# Patient Record
Sex: Male | Born: 1990 | Race: Black or African American | Hispanic: No | Marital: Single | Smoking: Never smoker
Health system: Southern US, Community
[De-identification: ages and names within clinical notes are randomized; demographics above are authoritative.]

---

## 2010-09-08 ENCOUNTER — Emergency Department (HOSPITAL_COMMUNITY)
Admission: EM | Admit: 2010-09-08 | Discharge: 2010-09-08 | Disposition: A | Discharge status: Home / Self Care | Payer: 59 | Attending: Emergency Medicine | Admitting: Emergency Medicine

## 2010-09-08 DIAGNOSIS — J029 Acute pharyngitis, unspecified: Secondary | ICD-10-CM | POA: Insufficient documentation

## 2010-09-28 ENCOUNTER — Emergency Department (HOSPITAL_COMMUNITY): Payer: 59

## 2010-09-28 ENCOUNTER — Emergency Department (HOSPITAL_COMMUNITY)
Admission: EM | Admit: 2010-09-28 | Discharge: 2010-09-29 | Disposition: A | Discharge status: Home / Self Care | Payer: 59 | Attending: Emergency Medicine | Admitting: Emergency Medicine

## 2010-09-28 DIAGNOSIS — H539 Unspecified visual disturbance: Secondary | ICD-10-CM | POA: Insufficient documentation

## 2010-09-28 DIAGNOSIS — R42 Dizziness and giddiness: Secondary | ICD-10-CM | POA: Insufficient documentation

## 2010-09-28 DIAGNOSIS — R110 Nausea: Secondary | ICD-10-CM | POA: Insufficient documentation

## 2010-09-28 DIAGNOSIS — N342 Other urethritis: Secondary | ICD-10-CM | POA: Insufficient documentation

## 2010-09-28 DIAGNOSIS — G43909 Migraine, unspecified, not intractable, without status migrainosus: Secondary | ICD-10-CM | POA: Insufficient documentation

## 2010-09-28 LAB — POCT I-STAT, CHEM 8
BUN: 10 mg/dL (ref 6–23)
Creatinine, Ser: 0.9 mg/dL (ref 0.50–1.35)
Glucose, Bld: 81 mg/dL (ref 70–99)
Potassium: 4.1 mEq/L (ref 3.5–5.1)
Sodium: 140 mEq/L (ref 135–145)

## 2010-09-30 LAB — GC/CHLAMYDIA PROBE AMP, GENITAL: Chlamydia, DNA Probe: POSITIVE — AB

## 2011-01-19 ENCOUNTER — Encounter: Payer: Self-pay | Admitting: Emergency Medicine

## 2011-01-19 ENCOUNTER — Emergency Department (HOSPITAL_COMMUNITY)
Admission: EM | Admit: 2011-01-19 | Discharge: 2011-01-19 | Payer: 59 | Attending: Emergency Medicine | Admitting: Emergency Medicine

## 2011-01-19 DIAGNOSIS — B9789 Other viral agents as the cause of diseases classified elsewhere: Secondary | ICD-10-CM | POA: Insufficient documentation

## 2011-01-19 DIAGNOSIS — J029 Acute pharyngitis, unspecified: Secondary | ICD-10-CM | POA: Insufficient documentation

## 2011-01-19 DIAGNOSIS — R509 Fever, unspecified: Secondary | ICD-10-CM | POA: Insufficient documentation

## 2011-01-19 DIAGNOSIS — IMO0001 Reserved for inherently not codable concepts without codable children: Secondary | ICD-10-CM | POA: Insufficient documentation

## 2011-01-19 DIAGNOSIS — B349 Viral infection, unspecified: Secondary | ICD-10-CM

## 2011-01-19 LAB — RAPID STREP SCREEN (MED CTR MEBANE ONLY): Streptococcus, Group A Screen (Direct): NEGATIVE

## 2011-01-19 MED ORDER — IBUPROFEN 800 MG PO TABS
800.0000 mg | ORAL_TABLET | Freq: Once | ORAL | Status: AC
  Administered 2011-01-19: 800 mg via ORAL
  Filled 2011-01-19: qty 1

## 2011-01-19 MED ORDER — ACETAMINOPHEN 325 MG PO TABS
ORAL_TABLET | ORAL | Status: AC
  Administered 2011-01-19: 650 mg via ORAL
  Filled 2011-01-19: qty 2

## 2011-01-19 NOTE — ED Notes (Signed)
Fever chills and body aches since yesterday

## 2011-01-19 NOTE — ED Notes (Signed)
Vital signs stable.  Temp 98.7 oral.  Pt is having nausea and ambulates to the bathroom to void

## 2011-01-19 NOTE — ED Provider Notes (Signed)
History     CSN: [id number] Arrival date & time: 01/19/2011  2:34 PM   First MD Initiated Contact with Patient 01/19/11 1516      Chief Complaint  Patient presents with  . Generalized Body Aches    (Consider location/radiation/quality/duration/timing/severity/associated sxs/prior treatment) The history is provided by the patient.  pt with 24 hours h/o fever and sore throat--no vomiting or diarrhea or rash--no meds used pta--no cough, positive sick exposures, denies photophobia or neck pain--nothing makes sx better or worse  History reviewed. No pertinent past medical history.  History reviewed. No pertinent past surgical history.  No family history on file.  History  Substance Use Topics  . Smoking status: Not on file  . Smokeless tobacco: Not on file  . Alcohol Use: Not on file      Review of Systems  All other systems reviewed and are negative.    Allergies  Review of patient's allergies indicates no known allergies.  Home Medications   Current Outpatient Rx  Name Route Sig Dispense Refill  . BENZOCAINE-MENTHOL 6-10 MG MT LOZG Oral Take 1 lozenge by mouth every 2 (two) hours as needed. For sore throat     . DIPHENHYDRAMINE HCL 25 MG PO TABS Oral Take 25 mg by mouth every 6 (six) hours as needed. For congestion       BP 117/73  Pulse 106  Temp(Src) 103 F (39.4 C) (Oral)  Resp 20  SpO2 98%  Physical Exam  Nursing note and vitals reviewed. Constitutional: He is oriented to person, place, and time. He appears well-developed and well-nourished.  Non-toxic appearance. No distress.  HENT:  Head: Normocephalic and atraumatic.  Mouth/Throat: Posterior oropharyngeal erythema present. No oropharyngeal exudate.  Eyes: Conjunctivae and EOM are normal. Pupils are equal, round, and reactive to light.  Neck: Normal range of motion. Neck supple. No tracheal deviation present.  Cardiovascular: Normal heart sounds.  Tachycardia present.  Exam reveals no gallop.   No  murmur heard. Pulmonary/Chest: Effort normal and breath sounds normal. No stridor. No respiratory distress. He has no wheezes.  Abdominal: Soft. Normal appearance and bowel sounds are normal. He exhibits no distension. There is no tenderness. There is no rebound.  Musculoskeletal: Normal range of motion. He exhibits no edema and no tenderness.  Neurological: He is alert and oriented to person, place, and time. He has normal strength. No cranial nerve deficit or sensory deficit. GCS eye subscore is 4. GCS verbal subscore is 5. GCS motor subscore is 6.  Skin: Skin is warm and dry.  Psychiatric: He has a normal mood and affect. His speech is normal and behavior is normal.    ED Course  Procedures (including critical care time)   Labs Reviewed  RAPID STREP SCREEN   No results found.   No diagnosis found.    MDM  Labs reviewed, suspect viral syndrome, will d/c        Toy Baker, MD 01/19/11 [phone]

## 2011-01-19 NOTE — ED Notes (Signed)
Family at bedside. 

## 2011-02-12 ENCOUNTER — Emergency Department (HOSPITAL_COMMUNITY)
Admission: EM | Admit: 2011-02-12 | Discharge: 2011-02-12 | Disposition: A | Discharge status: Home / Self Care | Payer: 59 | Attending: Emergency Medicine | Admitting: Emergency Medicine

## 2011-02-12 ENCOUNTER — Encounter (HOSPITAL_COMMUNITY): Payer: Self-pay | Admitting: *Deleted

## 2011-02-12 DIAGNOSIS — L309 Dermatitis, unspecified: Secondary | ICD-10-CM

## 2011-02-12 DIAGNOSIS — L299 Pruritus, unspecified: Secondary | ICD-10-CM | POA: Insufficient documentation

## 2011-02-12 DIAGNOSIS — L259 Unspecified contact dermatitis, unspecified cause: Secondary | ICD-10-CM | POA: Insufficient documentation

## 2011-02-12 MED ORDER — AZITHROMYCIN 250 MG PO TABS
1000.0000 mg | ORAL_TABLET | Freq: Once | ORAL | Status: AC
  Administered 2011-02-12: 1000 mg via ORAL
  Filled 2011-02-12: qty 4

## 2011-02-12 MED ORDER — LIDOCAINE HCL (PF) 1 % IJ SOLN
INTRAMUSCULAR | Status: AC
  Administered 2011-02-12: 1 mL
  Filled 2011-02-12: qty 5

## 2011-02-12 MED ORDER — CEFTRIAXONE SODIUM 250 MG IJ SOLR
250.0000 mg | Freq: Once | INTRAMUSCULAR | Status: AC
  Administered 2011-02-12: 250 mg via INTRAMUSCULAR
  Filled 2011-02-12: qty 250

## 2011-02-12 NOTE — ED Notes (Signed)
Patient states onset 2 weeks ago tiny nodules on patient penis with one blister. Denies pain or itching concerned what is it.  Patient states started to use a new soap, sexual active uses protection.  Denies any urinary complaints.

## 2011-02-12 NOTE — ED Notes (Signed)
The pt wants to be checked for a std.  He has lesions that he has had for 2 weeks

## 2011-02-12 NOTE — ED Notes (Signed)
Patient given discharge paperwork; went over discharge instructions with patient.  Patient instructed to follow up with Urgent Care and to return to the ED for new, worsening, or concerning symptoms.

## 2011-02-12 NOTE — ED Notes (Signed)
Received bedside report from [place], [place].  Patient currently resting comfortably in bed; no respiratory or acute distress noted.  Introduced self to patient and updated patient on plan of care; informed patient that we are currently waiting on the EDP to come and assess patient.  Patient has no other questions or concerns at this time; will continue to monitor.

## 2011-02-12 NOTE — ED Notes (Signed)
Tiffany Greene, PA at bedside 

## 2011-02-12 NOTE — ED Provider Notes (Signed)
History     CSN: [id number] Arrival date & time: 02/12/2011  6:08 PM   First MD Initiated Contact with Patient 02/12/11 2002      Chief Complaint  Patient presents with  . SEXUALLY TRANSMITTED DISEASE    (Consider location/radiation/quality/duration/timing/severity/associated sxs/prior treatment) The history is provided by the patient.    Pt presents to the Ed with complaints of a small rash on the anterior shaft of his penis. It is not painful, itchy, red, or draining. He denies penile discharge. His girlfriend who is present and him have been together for 6 months and use protection every time. The girlfriend denies any rashes, discharge or odor. He has had the small rash for 2 weeks. And does know have the rash anywhere else. The patient says that he tried a new condom but does not remember what came first, the rash or the new condom.  History reviewed. No pertinent past medical history.  History reviewed. No pertinent past surgical history.  History reviewed. No pertinent family history.  History  Substance Use Topics  . Smoking status: Never Smoker   . Smokeless tobacco: Not on file  . Alcohol Use: No      Review of Systems  All other systems reviewed and are negative.    Allergies  Review of patient's allergies indicates no known allergies.  Home Medications   Current Outpatient Rx  Name Route Sig Dispense Refill  . BENZOCAINE-MENTHOL 6-10 MG MT LOZG Oral Take 1 lozenge by mouth every 2 (two) hours as needed. For sore throat       BP 134/84  Pulse 104  Temp(Src) 98.1 F (36.7 C) (Oral)  Resp 14  SpO2 98%  Physical Exam  Nursing note and vitals reviewed. Constitutional: He appears well-developed and well-nourished.  HENT:  Head: Normocephalic and atraumatic.  Eyes: Pupils are equal, round, and reactive to light.  Neck: Normal range of motion.  Cardiovascular: Normal rate and regular rhythm.   Pulmonary/Chest: Effort normal and breath sounds  normal.  Genitourinary:     Musculoskeletal: Normal range of motion.  Neurological: He is alert.  Skin: Skin is warm and dry.    ED Course  Procedures (including critical care time)  Labs Reviewed - No data to display No results found.   No diagnosis found.    MDM  The rash on the penis is not consistent with any STD. It looks like dermatitis, no ulcerations, not red, or painful, no discharge, no other lesions, not wart like. The patient has been using a new brand of condom. I have recommended they try latex free and he use lotion on all of his body as most of his skin is very dry. Pt given strict instructions to come back if anything changes.      Dorthula Matas, PA 02/12/11 2023

## 2011-02-12 NOTE — ED Notes (Signed)
GC/Chlamydia Probe placed at bedside for PA use.

## 2011-02-12 NOTE — ED Notes (Signed)
Patient currently resting quietly in bed; no respiratory or acute distress noted.  Family present at bedside.  Patient has no questions or concerns at this time.  Will continue to monitor. 

## 2011-02-12 NOTE — ED Provider Notes (Signed)
Medical screening examination/treatment/procedure(s) were performed by non-physician practitioner and as supervising physician I was immediately available for consultation/collaboration.   Glynn Octave, MD 02/12/11 [phone]

## 2011-02-12 NOTE — ED Notes (Signed)
Patient waiting in waiting area another 10 minutes before discharge to watch for any reaction that could occur from medication administration.

## 2011-03-16 ENCOUNTER — Emergency Department (HOSPITAL_COMMUNITY)
Admission: EM | Admit: 2011-03-16 | Discharge: 2011-03-16 | Disposition: A | Discharge status: Home / Self Care | Payer: 59 | Attending: Emergency Medicine | Admitting: Emergency Medicine

## 2011-03-16 ENCOUNTER — Encounter (HOSPITAL_COMMUNITY): Payer: Self-pay | Admitting: *Deleted

## 2011-03-16 DIAGNOSIS — B081 Molluscum contagiosum: Secondary | ICD-10-CM | POA: Insufficient documentation

## 2011-03-16 DIAGNOSIS — R21 Rash and other nonspecific skin eruption: Secondary | ICD-10-CM | POA: Insufficient documentation

## 2011-03-16 NOTE — ED Provider Notes (Signed)
History     CSN: [id number]  Arrival date & time 03/16/11  2054   First MD Initiated Contact with Patient 03/16/11 2300      Chief Complaint  Patient presents with  . Allergic Reaction    (Consider location/radiation/quality/duration/timing/severity/associated sxs/prior treatment) Patient is a 21 y.o. male presenting with allergic reaction and STD exposure. The history is provided by the patient. No language interpreter was used.  Allergic Reaction The primary symptoms are  rash. The current episode started more than 2 days ago (5 weeks ago). The problem has not changed since onset.This is a chronic problem.  Location: penis. The rash is not associated with itching.  Significant symptoms that are not present include itching.  Exposure to STD This is a new problem. The current episode started more than 1 week ago. The symptoms are aggravated by nothing. The symptoms are relieved by nothing. He has tried nothing for the symptoms. The treatment provided no relief.  States he wears a condom.  Doesn't know how this happened.    History reviewed. No pertinent past medical history.  History reviewed. No pertinent past surgical history.  History reviewed. No pertinent family history.  History  Substance Use Topics  . Smoking status: Never Smoker   . Smokeless tobacco: Not on file  . Alcohol Use: No      Review of Systems  Constitutional: Negative.   HENT: Negative.   Eyes: Negative.   Respiratory: Negative.   Cardiovascular: Negative.   Gastrointestinal: Negative.   Genitourinary: Negative for dysuria, discharge, penile pain and testicular pain.  Skin: Positive for rash. Negative for itching.  Neurological: Negative.   Hematological: Negative.   Psychiatric/Behavioral: Negative.     Allergies  Review of patient's allergies indicates no known allergies.  Home Medications  No current outpatient prescriptions on file.  BP 139/73  Pulse 93  Temp(Src) 98.3 F (36.8 C)  (Oral)  Resp 20  Ht 5\' 6"  (1.676 m)  Wt 130 lb (58.968 kg)  BMI 20.98 kg/m2  SpO2 99%  Physical Exam  Constitutional: He is oriented to person, place, and time. He appears well-developed and well-nourished.  HENT:  Head: Normocephalic and atraumatic.  Eyes: Conjunctivae and EOM are normal.  Neck: Normal range of motion. Neck supple.  Cardiovascular: Normal rate and regular rhythm.   Pulmonary/Chest: Effort normal and breath sounds normal. He has no wheezes.  Abdominal: Soft. Bowel sounds are normal. There is no tenderness.  Genitourinary: No penile tenderness.       Multiple raised lesions which are umbilicated cw molluscum.  Karen Kitchens present as chaperone x 2  Musculoskeletal: He exhibits no edema.  Neurological: He is alert and oriented to person, place, and time.  Skin: Skin is warm. Rash noted.  Psychiatric: Thought content normal.    ED Course  Procedures (including critical care time)  Labs Reviewed - No data to display No results found.   No diagnosis found.    MDM  Patient wants swab for GC and chlamydia.  Will be provided derm referral for molluscum and patient informed he will be called if GC and chlamydia are positive.  Verbalizes understanding and agrees to follow up        Fritzie Prioleau K Devanee Pomplun-Rasch, MD 03/16/11 2320

## 2011-03-16 NOTE — ED Notes (Signed)
Pt reports having dx of "allergic reaction from the condom 2 weeks ago but the rash hasn't went away". Pt has whit bumps sporadically on shaft of penis, pt denies any discharge, urinary problems or bleeding from penile.

## 2011-03-16 NOTE — ED Notes (Signed)
Pt reports a rash in his private area that he thinks is an allergic reaction to soap that he is using.  Denies STD.  Denies drainage from penis.

## 2011-03-16 NOTE — ED Notes (Signed)
Pt denies any pain or questions upon discharge. 

## 2011-03-17 LAB — GC/CHLAMYDIA PROBE AMP, GENITAL: Chlamydia, DNA Probe: NEGATIVE

## 2011-07-24 DIAGNOSIS — A64 Unspecified sexually transmitted disease: Secondary | ICD-10-CM | POA: Insufficient documentation

## 2011-07-24 DIAGNOSIS — R369 Urethral discharge, unspecified: Secondary | ICD-10-CM | POA: Insufficient documentation

## 2011-07-24 DIAGNOSIS — R599 Enlarged lymph nodes, unspecified: Secondary | ICD-10-CM | POA: Insufficient documentation

## 2011-07-25 ENCOUNTER — Encounter (HOSPITAL_COMMUNITY): Payer: Self-pay | Admitting: Emergency Medicine

## 2011-07-25 ENCOUNTER — Emergency Department (HOSPITAL_COMMUNITY)
Admission: EM | Admit: 2011-07-25 | Discharge: 2011-07-25 | Disposition: A | Discharge status: Home / Self Care | Payer: 59 | Attending: Emergency Medicine | Admitting: Emergency Medicine

## 2011-07-25 DIAGNOSIS — A64 Unspecified sexually transmitted disease: Secondary | ICD-10-CM

## 2011-07-25 DIAGNOSIS — R369 Urethral discharge, unspecified: Secondary | ICD-10-CM

## 2011-07-25 MED ORDER — METRONIDAZOLE 500 MG PO TABS
2000.0000 mg | ORAL_TABLET | Freq: Once | ORAL | Status: AC
  Administered 2011-07-25: 2000 mg via ORAL
  Filled 2011-07-25: qty 4

## 2011-07-25 MED ORDER — AZITHROMYCIN 250 MG PO TABS
1000.0000 mg | ORAL_TABLET | Freq: Once | ORAL | Status: AC
  Administered 2011-07-25: 1000 mg via ORAL
  Filled 2011-07-25: qty 4

## 2011-07-25 MED ORDER — CEFTRIAXONE SODIUM 250 MG IJ SOLR
250.0000 mg | Freq: Once | INTRAMUSCULAR | Status: AC
  Administered 2011-07-25: 250 mg via INTRAMUSCULAR
  Filled 2011-07-25: qty 250

## 2011-07-25 MED ORDER — LIDOCAINE HCL (PF) 1 % IJ SOLN
INTRAMUSCULAR | Status: AC
  Administered 2011-07-25: 2 mL
  Filled 2011-07-25: qty 5

## 2011-07-25 NOTE — Discharge Instructions (Signed)
You were seen and treated for your penile discharge. At this time your providers were concerned for a sexually transmitted disease. You're treated for possible gonorrhea, Chlamydia or Trichomonas infection. You also had a gonorrhea and Chlamydia tests performed and results will take 2 days. You should inform all sexual partners of your diagnosis and advised them to get evaluated. It is also recommended that you followup with [facility] health Department sexually transmitted disease clinic for continued evaluation and treatment including HIV testing and syphilis testing.   Sexually Transmitted Disease Sexually transmitted disease (STD) refers to any infection that is passed from person to person during sexual activity. This may happen by way of saliva, semen, blood, vaginal mucus, or urine. Common STDs include:  Gonorrhea.   Chlamydia.   Syphilis.   HIV/AIDS.   Genital herpes.   Hepatitis B and C.   Trichomonas.   Human papillomavirus (HPV).   Pubic lice.  CAUSES  An STD may be spread by bacteria, virus, or parasite. A person can get an STD by:  Sexual intercourse with an infected person.   Sharing sex toys with an infected person.   Sharing needles with an infected person.   Having intimate contact with the genitals, mouth, or rectal areas of an infected person.  SYMPTOMS  Some people may not have any symptoms, but they can still pass the infection to others. Different STDs have different symptoms. Symptoms include:  Painful or bloody urination.   Pain in the pelvis, abdomen, vagina, anus, throat, or eyes.   Skin rash, itching, irritation, growths, or sores (lesions). These usually occur in the genital or anal area.   Abnormal vaginal discharge.   Penile discharge in men.   Soft, flesh-colored skin growths in the genital or anal area.   Fever.   Pain or bleeding during sexual intercourse.   Swollen glands in the groin area.   Yellow skin and eyes (jaundice).  This is seen with hepatitis.  DIAGNOSIS  To make a diagnosis, your caregiver may:  Take a medical history.   Perform a physical exam.   Take a specimen (culture) to be examined.   Examine a sample of discharge under a microscope.   Perform blood tests.   Perform a Pap test, if this applies.   Perform a colposcopy.   Perform a laparoscopy.  TREATMENT   Chlamydia, gonorrhea, trichomonas, and syphilis can be cured with antibiotic medicine.   Genital herpes, hepatitis, and HIV can be treated, but not cured, with prescribed medicines. The medicines will lessen the symptoms.   Genital warts from HPV can be treated with medicine or by freezing, burning (electrocautery), or surgery. Warts may come back.   HPV is a virus and cannot be cured with medicine or surgery.However, abnormal areas may be followed very closely by your caregiver and may be removed from the cervix, vagina, or vulva through office procedures or surgery.  If your diagnosis is confirmed, your recent sexual partners need treatment. This is true even if they are symptom-free or have a negative culture or evaluation. They should not have sex until their caregiver says it is okay. HOME CARE INSTRUCTIONS  All sexual partners should be informed, tested, and treated for all STDs.   Take your antibiotics as directed. Finish them even if you start to feel better.   Only take over-the-counter or prescription medicines for pain, discomfort, or fever as directed by your caregiver.   Rest.   Eat a balanced diet and drink enough fluids  to keep your urine clear or pale yellow.   Do not have sex until treatment is completed and you have followed up with your caregiver. STDs should be checked after treatment.   Keep all follow-up appointments, Pap tests, and blood tests as directed by your caregiver.   Only use latex condoms and water-soluble lubricants during sexual activity. Do not use petroleum jelly or oils.   Avoid  alcohol and illegal drugs.   Get vaccinated for HPV and hepatitis. If you have not received these vaccines in the past, talk to your caregiver about whether one or both might be right for you.   Avoid risky sex practices that can break the skin.  The only way to avoid getting an STD is to avoid all sexual activity.Latex condoms and dental dams (for oral sex) will help lessen the risk of getting an STD, but will not completely eliminate the risk. SEEK MEDICAL CARE IF:   You have a fever.   You have any new or worsening symptoms.  Document Released: 05/08/2002 Document Revised: 02/04/2011 Document Reviewed: 05/15/2010 [facility] Patient Information 2012 [place], [place].    RESOURCE GUIDE  Dental Problems  Patients with Medicaid: [facility] [phone] [address]                                           [phone] [address] Phone:  [phone]                                                  Phone:  [phone]  If unable to pay or uninsured, contact:  [facility] or [facility]. to become qualified for the adult dental clinic.  Chronic Pain Problems Contact [facility]  [phone] Patients need to be referred by their primary care doctor.  Insufficient Money for Medicine Contact [employer]:  call "211" or [facility] 404-385-5538.  No Primary Care Doctor Call [facility]  941-508-7461 Other agencies that provide inexpensive medical care    [facility]  [phone]    [facility]  [phone]    [facility]  918-339-4402    [facility]  [phone]    [facility]  [phone]    [facility]  [phone]  Psychological Services [facility] Behavioral Health  3800202744 [facility] Services  [phone] [facility] Mental Health   [phone] (emergency services 5643149036)  Substance Abuse Resources [facility]  (918)782-3157 [facility] 916-327-1819 [facility] [place] [facility] 510-094-6982 Floydene Flock 601-586-1137 Residential & Outpatient Substance Abuse Program  [phone]  Abuse/Neglect [facility] Child Abuse Hotline (980) 783-9218 [facility] Child Abuse Hotline 8160696940 (After Hours)  Emergency Shelter [facility] [phone]  Maternity Homes [facility] [place] [facility] [place] [phone] [place] Alert Services ([phone])[phone]  MRSA Hotline #:   854-436-1618    [facility] [place]  [facility] Dept. [address]      [place] Phone:  [phone]                                   Phone:  [phone]                 Phone:  [place] Phone:  [place] [phone] (After Hours)

## 2011-07-25 NOTE — ED Notes (Addendum)
abnormal drainage, no dysuria, denies pain started 2 weeks ago; denies exposure to STD, but would like to be checked

## 2011-07-25 NOTE — ED Notes (Signed)
Pt states that has believes he has been having uti syptoms. Pt states that he has been having drainage, but denies painful urination.

## 2011-07-25 NOTE — ED Provider Notes (Signed)
History     CSN: [id number]  Arrival date & time 07/24/11  2356   First MD Initiated Contact with Patient 07/25/11 0015      Chief Complaint  Patient presents with  . Penile Discharge    HPI  History provided by the patient. Patient is a 21 year old male with no significant past medical history who presents with complaints of penile discharge for the past 2 weeks. Symptoms came on acutely and have been persistent. Discharge is described as a clear white discharge. Patient denies any dysuria, urinary previous ear hematuria. Patient denies any associated tenderness in the groin, rash the skin or lesions. Patient denies having similar symptoms previously. Patient is sexually active and reports having a new sexual partner recently. Patient only occasionally uses barrier protection. Patient denies any testicle swelling testicle pain. Patient denies any abdominal pain, fever, chills, sweats.    History reviewed. No pertinent past medical history.  History reviewed. No pertinent past surgical history.  History reviewed. No pertinent family history.  History  Substance Use Topics  . Smoking status: Never Smoker   . Smokeless tobacco: Not on file  . Alcohol Use: No      Review of Systems  Constitutional: Negative for fever and chills.  Gastrointestinal: Negative for nausea, vomiting and abdominal pain.  Genitourinary: Positive for discharge. Negative for dysuria, frequency, hematuria, flank pain, penile swelling, scrotal swelling, penile pain and testicular pain.    Allergies  Review of patient's allergies indicates no known allergies.  Home Medications  No current outpatient prescriptions on file.  BP 128/88  Pulse 85  Temp(Src) 98.4 F (36.9 C) (Oral)  Resp 16  SpO2 98%  Physical Exam  Nursing note and vitals reviewed. Constitutional: He is oriented to person, place, and time. He appears well-developed and well-nourished. No distress.  HENT:  Head: Normocephalic.    Cardiovascular: Normal rate and regular rhythm.   Pulmonary/Chest: Effort normal and breath sounds normal.  Abdominal: Soft. There is no tenderness. Hernia confirmed negative in the right inguinal area and confirmed negative in the left inguinal area.  Genitourinary: Testes normal. Circumcised. No penile tenderness. Discharge found.  Lymphadenopathy:       Right: Inguinal adenopathy present.       Left: No inguinal adenopathy present.  Neurological: He is alert and oriented to person, place, and time.  Skin: Skin is warm. No rash noted.  Psychiatric: He has a normal mood and affect. His behavior is normal.    ED Course  Procedures     1. Penile discharge   2. Sexually transmitted disease       MDM  Patient seen and evaluated. Patient no acute distress. Patient with no acute penile discharge. Symptoms concerning for STD. GC Chlamydia sent. Will treat for GC Chlamydia and Trichomonas. Patient advised her diagnosis and importance to discuss with sexual partners as well as patient given instructions to follow up with [facility] health Department STD clinic for further evaluation and treatment.        Angus Seller, PA 07/25/11 0100

## 2011-07-26 NOTE — ED Provider Notes (Signed)
Medical screening examination/treatment/procedure(s) were performed by non-physician practitioner and as supervising physician I was immediately available for consultation/collaboration.    Vida Roller, MD 07/26/11 [phone]

## 2011-07-28 LAB — GC/CHLAMYDIA PROBE AMP, GENITAL
Chlamydia, DNA Probe: POSITIVE — AB
GC Probe Amp, Genital: NEGATIVE

## 2011-07-28 NOTE — ED Notes (Signed)
Pt called for STD results.  ID verified x 2.  (+) for chlamydia, treated with Zithromax and Rocephin.  Pt informed of (+) chlamydia, tx rcvd approp., notify partner(s) for testing and tx and abstain from sex x 2 wks post tx.  [facility] form completed and faxed.

## 2012-05-31 ENCOUNTER — Emergency Department (HOSPITAL_COMMUNITY)
Admission: EM | Admit: 2012-05-31 | Discharge: 2012-06-01 | Disposition: A | Discharge status: Home / Self Care | Payer: 59 | Attending: Emergency Medicine | Admitting: Emergency Medicine

## 2012-05-31 ENCOUNTER — Encounter (HOSPITAL_COMMUNITY): Payer: Self-pay | Admitting: Emergency Medicine

## 2012-05-31 ENCOUNTER — Emergency Department (HOSPITAL_COMMUNITY): Payer: 59

## 2012-05-31 DIAGNOSIS — Z23 Encounter for immunization: Secondary | ICD-10-CM | POA: Insufficient documentation

## 2012-05-31 DIAGNOSIS — S61409A Unspecified open wound of unspecified hand, initial encounter: Secondary | ICD-10-CM | POA: Insufficient documentation

## 2012-05-31 DIAGNOSIS — S61411A Laceration without foreign body of right hand, initial encounter: Secondary | ICD-10-CM

## 2012-05-31 DIAGNOSIS — Y9389 Activity, other specified: Secondary | ICD-10-CM | POA: Insufficient documentation

## 2012-05-31 DIAGNOSIS — W278XXA Contact with other nonpowered hand tool, initial encounter: Secondary | ICD-10-CM | POA: Insufficient documentation

## 2012-05-31 DIAGNOSIS — Y929 Unspecified place or not applicable: Secondary | ICD-10-CM | POA: Insufficient documentation
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
## 2012-06-01 MED ORDER — TETANUS-DIPHTH-ACELL PERTUSSIS 5-2.5-18.5 LF-MCG/0.5 IM SUSP
0.5000 mL | Freq: Once | INTRAMUSCULAR | Status: AC
  Administered 2012-06-01: 0.5 mL via INTRAMUSCULAR
  Filled 2012-06-01: qty 0.5

## 2012-06-01 NOTE — ED Provider Notes (Signed)
Medical screening examination/treatment/procedure(s) were conducted as a shared visit with non-physician practitioner(s) and myself.  I personally evaluated the patient during the encounter  Pt well appearing.  Advised need for sutures.  Pt agreeable Stable for d/c  Joya Gaskins, MD 06/01/12 0400

## 2012-06-01 NOTE — ED Provider Notes (Signed)
History     CSN: [id number]  Arrival date & time 05/31/12  2308   None     Chief Complaint  Patient presents with  . Hand Injury    (Consider location/radiation/quality/duration/timing/severity/associated sxs/prior treatment) Patient is a 22 y.o. male presenting with hand injury. The history is provided by the patient. No language interpreter was used.  Hand Injury Location:  Hand Time since incident:  1 hour Injury: yes   Mechanism of injury comment:  Stuck his hand into the metal part of a broom handle Hand location:  R palm Pain details:    Quality:  Sharp   Radiates to:  Does not radiate   Severity:  Moderate   Onset quality:  Sudden Chronicity:  New Dislocation: no   Foreign body present:  No foreign bodies Tetanus status:  Unknown Prior injury to area:  No Worsened by:  Movement Ineffective treatments:  None tried Associated symptoms: no fever, no numbness and no swelling     History reviewed. No pertinent past medical history.  History reviewed. No pertinent past surgical history.  No family history on file.  History  Substance Use Topics  . Smoking status: Never Smoker   . Smokeless tobacco: Not on file  . Alcohol Use: No      Review of Systems  Constitutional: Negative for fever and chills.  Gastrointestinal: Negative for nausea, vomiting and abdominal pain.  Skin: Positive for wound.    Allergies  Review of patient's allergies indicates no known allergies.  Home Medications  No current outpatient prescriptions on file.  BP 141/84  Pulse 69  Temp(Src) 99 F (37.2 C) (Oral)  Resp 16  SpO2 100%  Physical Exam  Nursing note and vitals reviewed. Constitutional: He is oriented to person, place, and time. He appears well-developed and well-nourished. No distress.  HENT:  Head: Normocephalic and atraumatic.  Right Ear: External ear normal.  Left Ear: External ear normal.  Nose: Nose normal.  Eyes: Conjunctivae are normal.  Neck: Normal  range of motion. No tracheal deviation present.  Cardiovascular: Normal rate, regular rhythm and normal heart sounds.   Pulmonary/Chest: Effort normal and breath sounds normal. No stridor.  Abdominal: Soft. He exhibits no distension. There is no tenderness.  Musculoskeletal: Normal range of motion.  Neurological: He is alert and oriented to person, place, and time.  Skin: Skin is warm and dry. Laceration noted. He is not diaphoretic.  3 cm jagged laceration on right hand - no foreign bodies visualized   Psychiatric: He has a normal mood and affect. His behavior is normal.    ED Course  Procedures (including critical care time)  Labs Reviewed - No data to display Dg Hand Complete Right  05/31/2012  *RADIOLOGY REPORT*  Clinical Data: Laceration  RIGHT HAND - COMPLETE 3+ VIEW  Comparison: None.  Findings: No acute fracture and no dislocation.  Bandage is seen layering on the soft tissues of the hand.  No obvious soft tissue radiopaque foreign body.  Chronic changes at the articulation between the lunate and triquetrum.  IMPRESSION: No acute bony pathology.  No evidence of radiopaque foreign body.   Original Report Authenticated By: Jolaine Click, M.D.    LACERATION REPAIR Performed by: Junious Silk Authorized by: Junious Silk Consent: Verbal consent obtained. Risks and benefits: risks, benefits and alternatives were discussed Consent given by: patient Patient identity confirmed: provided demographic data Prepped and Draped in normal sterile fashion Wound explored  Laceration Location: right palm  Laceration Length: 3cm  No Foreign Bodies seen or palpated  Anesthesia: local infiltration  Local anesthetic: lidocaine 2%   Anesthetic total: 6 ml  Irrigation method: syringe Amount of cleaning: standard  Skin closure: 4-0 Ethilon  Number of sutures: 4  Technique: simple interrupted   Patient tolerance: Patient tolerated the procedure well with no immediate  complications.  1. Hand laceration, right, initial encounter       MDM  Patient presents immediately after he hit his hand on the metal part of a broom handle. No tendon involvement. No foreign bodies visualized on XR. Simple repair done. Patient tolerated procedure well. TDaP given. Return precautions given. Return in 1 week for removal of sutures. Patient / Family / Caregiver informed of clinical course, understand medical decision-making process, and agree with plan.        Mora Bellman, PA-C 06/01/12 [phone]

## 2012-06-09 ENCOUNTER — Emergency Department (HOSPITAL_COMMUNITY)
Admission: EM | Admit: 2012-06-09 | Discharge: 2012-06-09 | Disposition: A | Discharge status: Home / Self Care | Payer: 59 | Attending: Emergency Medicine | Admitting: Emergency Medicine

## 2012-06-09 ENCOUNTER — Encounter (HOSPITAL_COMMUNITY): Payer: Self-pay | Admitting: *Deleted

## 2012-06-09 DIAGNOSIS — Z4802 Encounter for removal of sutures: Secondary | ICD-10-CM

## 2012-06-09 NOTE — ED Notes (Signed)
Reviewed wound care. Pt. Verbalized understanding.

## 2012-06-09 NOTE — ED Provider Notes (Signed)
Medical screening examination/treatment/procedure(s) were performed by non-physician practitioner and as supervising physician I was immediately available for consultation/collaboration.  [name], MD 06/09/12 [phone]

## 2012-06-09 NOTE — ED Provider Notes (Signed)
History    This chart was scribed for non-physician practitioner working with Zachary Chick, MD by Toya Smothers, ED Scribe. This patient was seen in room TR10C/TR10C and the patient's care was started at 9:41 PM.   CSN: [id number]  Arrival date & time 06/09/12  2042   First MD Initiated Contact with Patient 06/09/12 2052      Chief Complaint  Patient presents with  . Suture / Staple Removal    Patient is a 22 y.o. male presenting with suture removal. The history is provided by the patient. No language interpreter was used.  Suture / Staple Removal     Zachary Munoz is a 22 y.o. male who presents to the ED for removal of suture placed 1 week ago to the palm of the right hand. Per Pt, there has been no complications with the healing process, though today he feels mildly nauseous. No drainage denoted. No fever, chills, cough, congestion, rhinorrhea, chest pain, SOB, or n/v/d. Pt denies use of tobacco, alcohol, and illicit drug use.   History reviewed. No pertinent past medical history.  History reviewed. No pertinent past surgical history.  No family history on file.  History  Substance Use Topics  . Smoking status: Never Smoker   . Smokeless tobacco: Not on file  . Alcohol Use: No    Review of Systems  All other systems reviewed and are negative.    Allergies  Review of patient's allergies indicates no known allergies.  Home Medications  No current outpatient prescriptions on file.  BP 135/83  Pulse 82  Temp(Src) 99.2 F (37.3 C) (Oral)  Resp 16  SpO2 99%  Physical Exam  Nursing note and vitals reviewed. Constitutional: He is oriented to person, place, and time. He appears well-developed and well-nourished. No distress.  HENT:  Head: Normocephalic and atraumatic.  Eyes: EOM are normal.  Neck: Neck supple. No tracheal deviation present.  Cardiovascular: Normal rate.   Pulmonary/Chest: Effort normal. No respiratory distress.  Musculoskeletal: Normal range of  motion.  Right hand well healing laceration. No signs of infection.  Neurological: He is alert and oriented to person, place, and time.  Skin: Skin is warm and dry.  Psychiatric: He has a normal mood and affect. His behavior is normal.    ED Course  Procedures DIAGNOSTIC STUDIES: Oxygen Saturation is 99% on room air, normal by my interpretation.    COORDINATION OF CARE: 21:40- Evaluated Pt. Pt is awake, alert, and without distress. 21:42- Removed sutures. 21:43- Patient understand and agree with initial ED impression and plan with expectations set for ED visit.  SUTURE REMOVAL Performed by: Roxy Horseman  Consent: Verbal consent obtained. Consent given by: patient Required items: required blood products, implants, devices, and special equipment available Time out: Immediately prior to procedure a "time out" was called to verify the correct patient, procedure, equipment, support staff and site/side marked as required.  Location: right palm  Wound Appearance: clean  Sutures/Staples Removed: 4  Patient tolerance: Patient tolerated the procedure well with no immediate complications.    Labs Reviewed - No data to display No results found.   1. Visit for suture removal       MDM  Patient presents for suture removal. The wound is well healed without signs of infection.  The sutures are removed. Wound care and activity instructions given. Return prn.       I personally performed the services described in this documentation, which was scribed in my presence. The recorded information  has been reviewed and is accurate.     Roxy Horseman, PA-C 06/09/12 2147

## 2012-06-09 NOTE — ED Notes (Signed)
The pt has sutures placed one week ago. He is here to have them removed.  No complaints

## 2012-06-09 NOTE — ED Notes (Signed)
Pt. Here to have sutures removed in right hand. Had placed x1 week ago. No other complaints at this time. Suture removal kit at bedside

## 2013-03-23 ENCOUNTER — Encounter (HOSPITAL_COMMUNITY): Payer: Self-pay | Admitting: Emergency Medicine

## 2013-03-23 ENCOUNTER — Emergency Department (HOSPITAL_COMMUNITY)
Admission: EM | Admit: 2013-03-23 | Discharge: 2013-03-24 | Disposition: A | Discharge status: Home / Self Care | Payer: 59 | Attending: Emergency Medicine | Admitting: Emergency Medicine

## 2013-03-23 DIAGNOSIS — Z91040 Latex allergy status: Secondary | ICD-10-CM | POA: Insufficient documentation

## 2013-03-23 DIAGNOSIS — R109 Unspecified abdominal pain: Secondary | ICD-10-CM

## 2013-03-23 DIAGNOSIS — R1031 Right lower quadrant pain: Secondary | ICD-10-CM | POA: Insufficient documentation

## 2013-03-23 LAB — CBC WITH DIFFERENTIAL/PLATELET
Basophils Absolute: 0 10*3/uL (ref 0.0–0.1)
Basophils Relative: 0 % (ref 0–1)
EOS PCT: 1 % (ref 0–5)
Eosinophils Absolute: 0.1 10*3/uL (ref 0.0–0.7)
HEMATOCRIT: 44 % (ref 39.0–52.0)
HEMOGLOBIN: 15.3 g/dL (ref 13.0–17.0)
LYMPHS ABS: 3.4 10*3/uL (ref 0.7–4.0)
LYMPHS PCT: 53 % — AB (ref 12–46)
MCH: 28.5 pg (ref 26.0–34.0)
MCHC: 34.8 g/dL (ref 30.0–36.0)
MCV: 81.9 fL (ref 78.0–100.0)
MONO ABS: 0.7 10*3/uL (ref 0.1–1.0)
Monocytes Relative: 12 % (ref 3–12)
Neutro Abs: 2.1 10*3/uL (ref 1.7–7.7)
Neutrophils Relative %: 33 % — ABNORMAL LOW (ref 43–77)
Platelets: 191 10*3/uL (ref 150–400)
RBC: 5.37 MIL/uL (ref 4.22–5.81)
RDW: 12.7 % (ref 11.5–15.5)
WBC: 6.4 10*3/uL (ref 4.0–10.5)

## 2013-03-23 LAB — COMPREHENSIVE METABOLIC PANEL
ALT: 14 U/L (ref 0–53)
AST: 22 U/L (ref 0–37)
Albumin: 4.5 g/dL (ref 3.5–5.2)
Alkaline Phosphatase: 59 U/L (ref 39–117)
BILIRUBIN TOTAL: 0.5 mg/dL (ref 0.3–1.2)
BUN: 7 mg/dL (ref 6–23)
CALCIUM: 9.6 mg/dL (ref 8.4–10.5)
CHLORIDE: 101 meq/L (ref 96–112)
CO2: 28 meq/L (ref 19–32)
CREATININE: 0.81 mg/dL (ref 0.50–1.35)
GLUCOSE: 72 mg/dL (ref 70–99)
Potassium: 3.5 mEq/L — ABNORMAL LOW (ref 3.7–5.3)
Sodium: 142 mEq/L (ref 137–147)
Total Protein: 8.5 g/dL — ABNORMAL HIGH (ref 6.0–8.3)

## 2013-03-23 LAB — LIPASE, BLOOD: Lipase: 27 U/L (ref 11–59)

## 2013-03-23 NOTE — ED Notes (Signed)
Pt. reports intermittent RLQ pain for 3 days , denies nausea/vomitting or diarrhea . No fever or chills.

## 2013-03-24 ENCOUNTER — Emergency Department (HOSPITAL_COMMUNITY): Payer: 59

## 2013-03-24 ENCOUNTER — Encounter (HOSPITAL_COMMUNITY): Payer: Self-pay | Admitting: Emergency Medicine

## 2013-03-24 LAB — URINALYSIS, ROUTINE W REFLEX MICROSCOPIC
BILIRUBIN URINE: NEGATIVE
GLUCOSE, UA: NEGATIVE mg/dL
Hgb urine dipstick: NEGATIVE
Ketones, ur: NEGATIVE mg/dL
Leukocytes, UA: NEGATIVE
Nitrite: NEGATIVE
PROTEIN: NEGATIVE mg/dL
Specific Gravity, Urine: 1.024 (ref 1.005–1.030)
UROBILINOGEN UA: 0.2 mg/dL (ref 0.0–1.0)
pH: 7 (ref 5.0–8.0)

## 2013-03-24 MED ORDER — SODIUM CHLORIDE 0.9 % IV SOLN
Freq: Once | INTRAVENOUS | Status: AC
  Administered 2013-03-24: via INTRAVENOUS

## 2013-03-24 MED ORDER — IOHEXOL 300 MG/ML  SOLN
100.0000 mL | Freq: Once | INTRAMUSCULAR | Status: AC | PRN
  Administered 2013-03-24: 100 mL via INTRAVENOUS

## 2013-03-24 MED ORDER — HYDROCODONE-ACETAMINOPHEN 5-325 MG PO TABS: 2.0000 | ORAL_TABLET | ORAL | Status: DC | PRN

## 2013-03-24 MED ORDER — IOHEXOL 300 MG/ML  SOLN
25.0000 mL | Freq: Once | INTRAMUSCULAR | Status: AC | PRN
  Administered 2013-03-24: 25 mL via ORAL

## 2013-03-24 NOTE — ED Provider Notes (Signed)
CSN: [id number]     Arrival date & time 03/23/13  2208 History   First MD Initiated Contact with Patient 03/23/13 2358     Chief Complaint  Patient presents with  . Abdominal Pain   (Consider location/radiation/quality/duration/timing/severity/associated sxs/prior Treatment) HPI Comments: Patient with worsening RLQ pain that radiates to R testicle, denies dysuria, hematuria, Hx of stones, denies trauma, denies penile discharge   Patient is a 23 y.o. male presenting with abdominal pain. The history is provided by the patient.  Abdominal Pain Pain location:  RLQ Pain quality: aching and cramping   Pain radiates to:  Groin Pain severity:  Moderate Onset quality:  Gradual Duration:  3 days Timing:  Intermittent Progression:  Worsening Chronicity:  New Context: not laxative use and not trauma   Relieved by:  None tried Ineffective treatments:  None tried Associated symptoms: no constipation, no cough, no diarrhea, no dysuria, no fever, no flatus, no hematuria, no nausea and no vomiting     History reviewed. No pertinent past medical history. History reviewed. No pertinent past surgical history. History reviewed. No pertinent family history. History  Substance Use Topics  . Smoking status: Never Smoker   . Smokeless tobacco: Not on file  . Alcohol Use: No    Review of Systems  Constitutional: Negative for fever.  Respiratory: Negative for cough.   Gastrointestinal: Positive for abdominal pain. Negative for nausea, vomiting, diarrhea, constipation, blood in stool and flatus.  Genitourinary: Negative for dysuria, hematuria and flank pain.  Skin: Negative for rash and wound.    Allergies  Latex  Home Medications   Current Outpatient Rx  Name  Route  Sig  Dispense  Refill  . HYDROcodone-acetaminophen (NORCO/VICODIN) 5-325 MG per tablet   Oral   Take 2 tablets by mouth every 4 (four) hours as needed.   10 tablet   0    BP 126/84  Pulse 79  Temp(Src) 98.2 F (36.8 C)  (Oral)  Resp 12  SpO2 100% Physical Exam  Nursing note and vitals reviewed. Constitutional: He appears well-developed and well-nourished.  HENT:  Head: Normocephalic.  Eyes: Pupils are equal, round, and reactive to light.  Neck: Normal range of motion.  Cardiovascular: Normal rate and regular rhythm.   Pulmonary/Chest: Effort normal.  Abdominal: Soft. He exhibits no distension. There is no tenderness. Hernia confirmed negative in the right inguinal area and confirmed negative in the left inguinal area.  Genitourinary: Right testis shows no tenderness. Cremasteric reflex is not absent on the right side. Left testis shows no tenderness. Cremasteric reflex is not absent on the left side. Circumcised. No penile tenderness.  Musculoskeletal: Normal range of motion.  Skin: Skin is warm. No rash noted. No erythema.    ED Course  Procedures (including critical care time) Labs Review Labs Reviewed  CBC WITH DIFFERENTIAL - Abnormal; Notable for the following:    Neutrophils Relative % 33 (*)    Lymphocytes Relative 53 (*)    All other components within normal limits  COMPREHENSIVE METABOLIC PANEL - Abnormal; Notable for the following:    Potassium 3.5 (*)    Total Protein 8.5 (*)    All other components within normal limits  URINALYSIS, ROUTINE W REFLEX MICROSCOPIC - Abnormal; Notable for the following:    APPearance HAZY (*)    All other components within normal limits  LIPASE, BLOOD   Imaging Review Ct Abdomen Pelvis W Contrast  03/24/2013   CLINICAL DATA:  Right lower quadrant abdominal pain.  EXAM: CT  ABDOMEN AND PELVIS WITH CONTRAST  TECHNIQUE: Multidetector CT imaging of the abdomen and pelvis was performed using the standard protocol following bolus administration of intravenous contrast.  CONTRAST:  OMNIPAQUE IOHEXOL 300 MG/ML  SOLN  COMPARISON:  None.  FINDINGS: The visualized lung bases are clear.  The liver and spleen are unremarkable in appearance. The gallbladder is  within normal limits. The pancreas and adrenal glands are unremarkable.  The kidneys are unremarkable in appearance. There is no evidence of hydronephrosis. No renal or ureteral stones are seen. No perinephric stranding is appreciated.  No free fluid is identified. The small bowel is unremarkable in appearance. The stomach is within normal limits. No acute vascular abnormalities are seen.  The appendix is filled with air and grossly normal in caliber, without evidence for appendicitis. The colon is unremarkable in appearance.  The bladder is mildly distended and grossly unremarkable. The prostate is grossly normal in size, though difficult to fully characterize due to surrounding vasculature. No inguinal lymphadenopathy is seen.  No acute osseous abnormalities are identified.  IMPRESSION: Unremarkable contrast-enhanced CT of the abdomen and pelvis.   Electronically Signed   By: Roanna Raider M.D.   On: 03/24/2013 02:14    EKG Interpretation   None       MDM   1. Abdominal pain         Arman Filter, NP 03/24/13 [phone]

## 2013-03-24 NOTE — Discharge Instructions (Signed)
Abdominal Pain, Adult Many things can cause belly (abdominal) pain. Most times, the belly pain is not dangerous. Many cases of belly pain can be watched and treated at home. HOME CARE   Do not take medicines that help you go poop (laxatives) unless told to by your doctor.  Only take medicine as told by your doctor.  Eat or drink as told by your doctor. Your doctor will tell you if you should be on a special diet. GET HELP IF:  You do not know what is causing your belly pain.  You have belly pain while you are sick to your stomach (nauseous) or have runny poop (diarrhea).  You have pain while you pee or poop.  Your belly pain wakes you up at night.  You have belly pain that gets worse or better when you eat.  You have belly pain that gets worse when you eat fatty foods. GET HELP RIGHT AWAY IF:   The pain does not go away within 2 hours.  You have a fever.  You keep throwing up (vomiting).  The pain changes and is only in the right or left part of the belly.  You have bloody or tarry looking poop. MAKE SURE YOU:   Understand these instructions.  Will watch your condition.  Will get help right away if you are not doing well or get worse. Document Released: 08/04/2007 Document Revised: 12/06/2012 Document Reviewed: 10/25/2012 [facility] Patient Information 2014 [place] CenterExitCare, [place]LL[place]. tonight your lab values, urine and CT scan are all normal You have been give a prescription for Vicodin for pain control as needed Return if you develop new or worsening symptoms

## 2013-03-24 NOTE — ED Provider Notes (Signed)
Medical screening examination/treatment/procedure(s) were performed by non-physician practitioner and as supervising physician I was immediately available for consultation/collaboration.    Jadore Veals, MD 03/24/13 0456 

## 2013-03-24 NOTE — ED Notes (Signed)
CT notified that Patient has finished contrast.

## 2013-09-21 ENCOUNTER — Encounter (HOSPITAL_COMMUNITY): Payer: Self-pay | Admitting: Emergency Medicine

## 2013-09-21 ENCOUNTER — Emergency Department (HOSPITAL_COMMUNITY)
Admission: EM | Admit: 2013-09-21 | Discharge: 2013-09-21 | Disposition: A | Discharge status: Home / Self Care | Payer: 59 | Attending: Emergency Medicine | Admitting: Emergency Medicine

## 2013-09-21 DIAGNOSIS — R369 Urethral discharge, unspecified: Secondary | ICD-10-CM

## 2013-09-21 DIAGNOSIS — Z91040 Latex allergy status: Secondary | ICD-10-CM | POA: Insufficient documentation

## 2013-09-21 DIAGNOSIS — R1032 Left lower quadrant pain: Secondary | ICD-10-CM

## 2013-09-21 DIAGNOSIS — R109 Unspecified abdominal pain: Secondary | ICD-10-CM | POA: Insufficient documentation

## 2013-09-21 DIAGNOSIS — Z8619 Personal history of other infectious and parasitic diseases: Secondary | ICD-10-CM | POA: Insufficient documentation

## 2013-09-21 LAB — COMPREHENSIVE METABOLIC PANEL
ALT: 21 U/L (ref 0–53)
ANION GAP: 13 (ref 5–15)
AST: 22 U/L (ref 0–37)
Albumin: 4.2 g/dL (ref 3.5–5.2)
Alkaline Phosphatase: 52 U/L (ref 39–117)
BUN: 7 mg/dL (ref 6–23)
CALCIUM: 8.9 mg/dL (ref 8.4–10.5)
CO2: 28 meq/L (ref 19–32)
Chloride: 100 mEq/L (ref 96–112)
Creatinine, Ser: 0.95 mg/dL (ref 0.50–1.35)
GFR calc Af Amer: >90 mL/min (ref >90)
GLUCOSE: 98 mg/dL (ref 70–99)
Potassium: 3.6 mEq/L — ABNORMAL LOW (ref 3.7–5.3)
Sodium: 141 mEq/L (ref 137–147)
TOTAL PROTEIN: 7.5 g/dL (ref 6.0–8.3)
Total Bilirubin: 0.6 mg/dL (ref 0.3–1.2)

## 2013-09-21 LAB — CBC WITH DIFFERENTIAL/PLATELET
Basophils Absolute: 0 10*3/uL (ref 0.0–0.1)
Basophils Relative: 0 % (ref 0–1)
EOS ABS: 0.1 10*3/uL (ref 0.0–0.7)
EOS PCT: 2 % (ref 0–5)
HEMATOCRIT: 43 % (ref 39.0–52.0)
Hemoglobin: 14.1 g/dL (ref 13.0–17.0)
LYMPHS ABS: 3.4 10*3/uL (ref 0.7–4.0)
Lymphocytes Relative: 57 % — ABNORMAL HIGH (ref 12–46)
MCH: 27.1 pg (ref 26.0–34.0)
MCHC: 32.8 g/dL (ref 30.0–36.0)
MCV: 82.7 fL (ref 78.0–100.0)
Monocytes Absolute: 0.5 10*3/uL (ref 0.1–1.0)
Monocytes Relative: 9 % (ref 3–12)
Neutro Abs: 1.9 10*3/uL (ref 1.7–7.7)
Neutrophils Relative %: 32 % — ABNORMAL LOW (ref 43–77)
Platelets: 216 10*3/uL (ref 150–400)
RBC: 5.2 MIL/uL (ref 4.22–5.81)
RDW: 13 % (ref 11.5–15.5)
WBC: 5.9 10*3/uL (ref 4.0–10.5)

## 2013-09-21 LAB — URINALYSIS, ROUTINE W REFLEX MICROSCOPIC
GLUCOSE, UA: NEGATIVE mg/dL
Hgb urine dipstick: NEGATIVE
KETONES UR: NEGATIVE mg/dL
Leukocytes, UA: NEGATIVE
NITRITE: NEGATIVE
Protein, ur: NEGATIVE mg/dL
Specific Gravity, Urine: 1.028 (ref 1.005–1.030)
Urobilinogen, UA: 0.2 mg/dL (ref 0.0–1.0)
pH: 5.5 (ref 5.0–8.0)

## 2013-09-21 LAB — LIPASE, BLOOD: LIPASE: 23 U/L (ref 11–59)

## 2013-09-21 LAB — HIV ANTIBODY (ROUTINE TESTING W REFLEX): HIV 1&2 Ab, 4th Generation: NONREACTIVE

## 2013-09-21 MED ORDER — LIDOCAINE HCL (PF) 1 % IJ SOLN
INTRAMUSCULAR | Status: AC
  Administered 2013-09-21: 0.9 mL
  Filled 2013-09-21: qty 5

## 2013-09-21 MED ORDER — IBUPROFEN 400 MG PO TABS: 400.0000 mg | ORAL_TABLET | Freq: Four times a day (QID) | ORAL | Status: AC | PRN

## 2013-09-21 MED ORDER — CEFTRIAXONE SODIUM 250 MG IJ SOLR
250.0000 mg | Freq: Once | INTRAMUSCULAR | Status: AC
  Administered 2013-09-21: 250 mg via INTRAMUSCULAR
  Filled 2013-09-21: qty 250

## 2013-09-21 MED ORDER — AZITHROMYCIN 250 MG PO TABS
1000.0000 mg | ORAL_TABLET | Freq: Once | ORAL | Status: AC
  Administered 2013-09-21: 1000 mg via ORAL
  Filled 2013-09-21: qty 4

## 2013-09-21 NOTE — Discharge Instructions (Signed)
Abdominal Pain Many things can cause abdominal pain. Usually, abdominal pain is not caused by a disease and will improve without treatment. It can often be observed and treated at home. Your health care provider will do a physical exam and possibly order blood tests and X-rays to help determine the seriousness of your pain. However, in many cases, more time must pass before a clear cause of the pain can be found. Before that point, your health care provider may not know if you need more testing or further treatment. HOME CARE INSTRUCTIONS  Monitor your abdominal pain for any changes. The following actions may help to alleviate any discomfort you are experiencing:  Only take over-the-counter or prescription medicines as directed by your health care provider.  Do not take laxatives unless directed to do so by your health care provider.  Try a clear liquid diet (broth, tea, or water) as directed by your health care provider. Slowly move to a bland diet as tolerated. SEEK MEDICAL CARE IF:  You have unexplained abdominal pain.  You have abdominal pain associated with nausea or diarrhea.  You have pain when you urinate or have a bowel movement.  You experience abdominal pain that wakes you in the night.  You have abdominal pain that is worsened or improved by eating food.  You have abdominal pain that is worsened with eating fatty foods.  You have a fever. SEEK IMMEDIATE MEDICAL CARE IF:   Your pain does not go away within 2 hours.  You keep throwing up (vomiting).  Your pain is felt only in portions of the abdomen, such as the right side or the left lower portion of the abdomen.  You pass bloody or black tarry stools. MAKE SURE YOU:  Understand these instructions.   Will watch your condition.   Will get help right away if you are not doing well or get worse.  Document Released: 11/25/2004 Document Revised: 02/20/2013 Document Reviewed: 10/25/2012 [facility] Patient Information  2015 [place], [place]. This information is not intended to replace advice given to you by your health care provider. Make sure you discuss any questions you have with your health care provider. Sexually Transmitted Disease A sexually transmitted disease (STD) is a disease or infection that may be passed (transmitted) from person to person, usually during sexual activity. This may happen by way of saliva, semen, blood, vaginal mucus, or urine. Common STDs include:   Gonorrhea.   Chlamydia.   Syphilis.   HIV and AIDS.   Genital herpes.   Hepatitis B and C.   Trichomonas.   Human papillomavirus (HPV).   Pubic lice.   Scabies.  Mites.  Bacterial vaginosis. WHAT ARE CAUSES OF STDs? An STD may be caused by bacteria, a virus, or parasites. STDs are often transmitted during sexual activity if one person is infected. However, they may also be transmitted through nonsexual means. STDs may be transmitted after:   Sexual intercourse with an infected person.   Sharing sex toys with an infected person.   Sharing needles with an infected person or using unclean piercing or tattoo needles.  Having intimate contact with the genitals, mouth, or rectal areas of an infected person.   Exposure to infected fluids during birth. WHAT ARE THE SIGNS AND SYMPTOMS OF STDs? Different STDs have different symptoms. Some people may not have any symptoms. If symptoms are present, they may include:   Painful or bloody urination.   Pain in the pelvis, abdomen, vagina, anus, throat, or eyes.  A skin rash, itching, or irritation.  Growths, ulcerations, blisters, or sores in the genital and anal areas.  Abnormal vaginal discharge with or without bad odor.   Penile discharge in men.   Fever.   Pain or bleeding during sexual intercourse.   Swollen glands in the groin area.   Yellow skin and eyes (jaundice). This is seen with hepatitis.   Swollen  testicles.  Infertility.  Sores and blisters in the mouth. HOW ARE STDs DIAGNOSED? To make a diagnosis, your health care provider may:   Take a medical history.   Perform a physical exam.   Take a sample of any discharge to examine.  Swab the throat, cervix, opening to the penis, rectum, or vagina for testing.  Test a sample of your first morning urine.   Perform blood tests.   Perform a Pap test, if this applies.   Perform a colposcopy.   Perform a laparoscopy.  HOW ARE STDs TREATED? Treatment depends on the STD. Some STDs may be treated but not cured.   Chlamydia, gonorrhea, trichomonas, and syphilis can be cured with antibiotic medicine.   Genital herpes, hepatitis, and HIV can be treated, but not cured, with prescribed medicines. The medicines lessen symptoms.   Genital warts from HPV can be treated with medicine or by freezing, burning (electrocautery), or surgery. Warts may come back.   HPV cannot be cured with medicine or surgery. However, abnormal areas may be removed from the cervix, vagina, or vulva.   If your diagnosis is confirmed, your recent sexual partners need treatment. This is true even if they are symptom-free or have a negative culture or evaluation. They should not have sex until their health care providers say it is okay. HOW CAN I REDUCE MY RISK OF GETTING AN STD? Take these steps to reduce your risk of getting an STD:  Use latex condoms, dental dams, and water-soluble lubricants during sexual activity. Do not use petroleum jelly or oils.  Avoid having multiple sex partners.  Do not have sex with someone who has other sex partners.  Do not have sex with anyone you do not know or who is at high risk for an STD.  Avoid risky sex practices that can break your skin.  Do not have sex if you have open sores on your mouth or skin.  Avoid drinking too much alcohol or taking illegal drugs. Alcohol and drugs can affect your judgment and put  you in a vulnerable position.  Avoid engaging in oral and anal sex acts.  Get vaccinated for HPV and hepatitis. If you have not received these vaccines in the past, talk to your health care provider about whether one or both might be right for you.   If you are at risk of being infected with HIV, it is recommended that you take a prescription medicine daily to prevent HIV infection. This is called pre-exposure prophylaxis (PrEP). You are considered at risk if:  You are a man who has sex with other men (MSM).  You are a heterosexual man or woman and are sexually active with more than one partner.  You take drugs by injection.  You are sexually active with a partner who has HIV.  Talk with your health care provider about whether you are at high risk of being infected with HIV. If you choose to begin PrEP, you should first be tested for HIV. You should then be tested every 3 months for as long as you are taking PrEP.  WHAT SHOULD I DO IF I THINK I HAVE AN STD?  See your health care provider.   Tell your sexual partner(s). They should be tested and treated for any STDs.  Do not have sex until your health care provider says it is okay. WHEN SHOULD I GET IMMEDIATE MEDICAL CARE? Contact your health care provider right away if:   You have severe abdominal pain.  You are a man and notice swelling or pain in your testicles.  You are a woman and notice swelling or pain in your vagina. Document Released: 05/08/2002 Document Revised: 02/20/2013 Document Reviewed: 09/05/2012 [facility] Patient Information 2015 [place], [place]. This information is not intended to replace advice given to you by your health care provider. Make sure you discuss any questions you have with your health care provider.

## 2013-09-21 NOTE — ED Notes (Signed)
Pt states the pain in the lower abdomen has been ongoing x 1.5 weeks.  Denies passing gas.  Is non-tender to palpation.  Denies N/V/D.

## 2013-09-21 NOTE — ED Provider Notes (Signed)
CSN: [id number]     Arrival date & time 09/21/13  0059 History   None    Chief Complaint  Patient presents with  . Abdominal Pain     (Consider location/radiation/quality/duration/timing/severity/associated sxs/prior Treatment) HPI Comments: Pt comes in with cc of groin pain. Pt has been having pain x 10 days. States that there is no scrotal pain, no testicular pain, no penile penile pain - + Penile discharge, clear. + hx of chlamydia, 1.5 years, and currently having unprotected intercourse with 1 partner. Pt has no n/v/f/c.   Patient is a 23 y.o. male presenting with abdominal pain. The history is provided by the patient.  Abdominal Pain Associated symptoms: no hematuria     History reviewed. No pertinent past medical history. History reviewed. No pertinent past surgical history. No family history on file. History  Substance Use Topics  . Smoking status: Never Smoker   . Smokeless tobacco: Not on file  . Alcohol Use: No    Review of Systems  Gastrointestinal: Positive for abdominal pain.  Genitourinary: Positive for discharge. Negative for frequency, hematuria, penile swelling, scrotal swelling, penile pain and testicular pain.  Musculoskeletal: Negative for back pain.  Skin: Negative for rash.      Allergies  Latex  Home Medications   Prior to Admission medications   Medication Sig Start Date End Date Taking? Authorizing Provider  ibuprofen (ADVIL,MOTRIN) 400 MG tablet Take 1 tablet (400 mg total) by mouth every 6 (six) hours as needed. 09/21/13   Chrysta Fulcher Rhunette CroftNanavati, MD   BP 121/81  Pulse 62  Temp(Src) 99.1 F (37.3 C) (Oral)  Resp 16  Ht 5\' 7"  (1.702 m)  Wt 122 lb (55.339 kg)  BMI 19.10 kg/m2  SpO2 100% Physical Exam  Nursing note and vitals reviewed. Constitutional: He appears well-developed.  Cardiovascular: Normal rate.   Pulmonary/Chest: Effort normal.  Abdominal: Soft. He exhibits no distension. There is no tenderness.  Genitourinary: Penis normal.   Neurological: He is alert.    ED Course  Procedures (including critical care time) Labs Review Labs Reviewed  CBC WITH DIFFERENTIAL - Abnormal; Notable for the following:    Neutrophils Relative % 32 (*)    Lymphocytes Relative 57 (*)    All other components within normal limits  COMPREHENSIVE METABOLIC PANEL - Abnormal; Notable for the following:    Potassium 3.6 (*)    All other components within normal limits  URINALYSIS, ROUTINE W REFLEX MICROSCOPIC - Abnormal; Notable for the following:    Color, Urine AMBER (*)    Bilirubin Urine SMALL (*)    All other components within normal limits  GC/CHLAMYDIA PROBE AMP  LIPASE, BLOOD  HIV ANTIBODY (ROUTINE TESTING)    Imaging Review No results found.   EKG Interpretation None      MDM   Final diagnoses:  Penile discharge  Groin pain, left    Pt with some penile discharge. GC anf chlamydia meds given. Safe intercourse practice advised. Rest of the exam is benign for patient - who has intermittent left groin and lower quadr tenderness.  Derwood KaplanAnkit Valla Pacey, MD 09/21/13 [phone]

## 2013-09-21 NOTE — ED Notes (Signed)
Pt alert and oriented at discharge.  Pt provided discharge papers and follow up information regarding [facility] pt regarding results.  Pt verbalized understanding.  Pt ambulatory to the waiting room, offered a wheelchair but denied.

## 2013-09-21 NOTE — ED Notes (Signed)
Pt. reports low abdominal pain with diarrhea onset 1 1/2 weeks ago , denies nausea or vomitting , no fever or chills. Denies urinary discomfort .

## 2013-09-22 LAB — GC/CHLAMYDIA PROBE AMP
CT Probe RNA: NEGATIVE
GC Probe RNA: NEGATIVE

## 2014-11-10 IMAGING — CT CT ABD-PELV W/ CM
2 of 4 series · 17 of 46 positions shown, 19 images · IV contrast (CONTRAST)
Comparison: None.

CLINICAL DATA: Right lower quadrant abdominal pain.

EXAM:
CT ABDOMEN AND PELVIS WITH CONTRAST
TECHNIQUE: Multidetector CT imaging of the abdomen and pelvis was performed
using the standard protocol following bolus administration of
intravenous contrast.
CONTRAST:  100mL OMNIPAQUE IOHEXOL 300 MG/ML  SOLN

[Series 2: routine · axial · 0.64mm/px · z∈[+522,+887]mm · 14 of 81 slices shown, 16 images]
[im 4/81  soft-tissue]
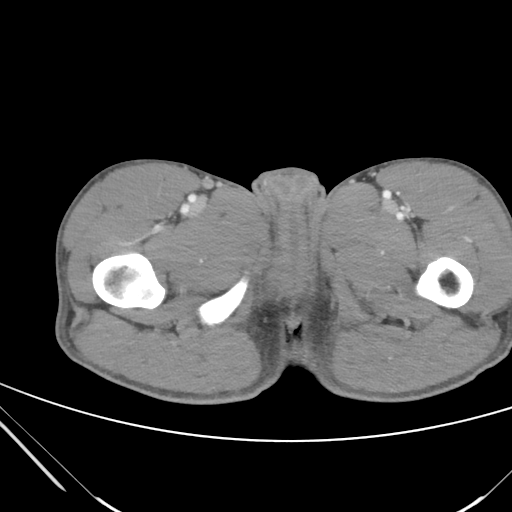
[im 4/81  bone]
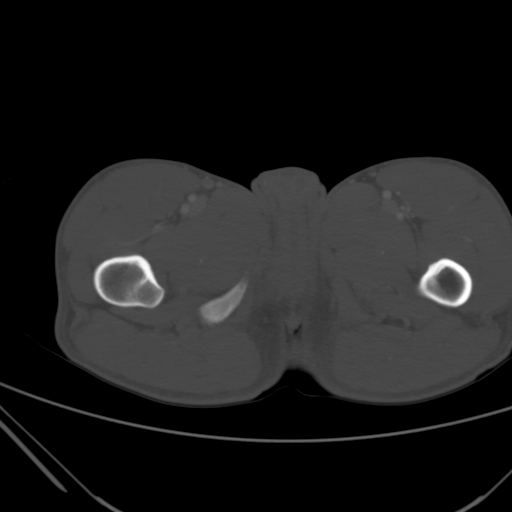
[im 11/81  soft-tissue]
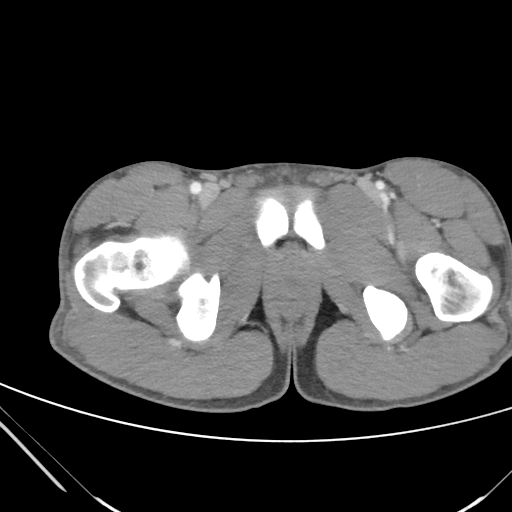
[im 17/81  soft-tissue]
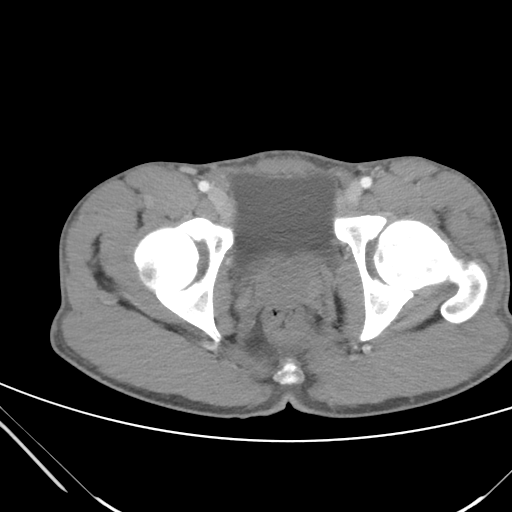
[im 21/81  soft-tissue]
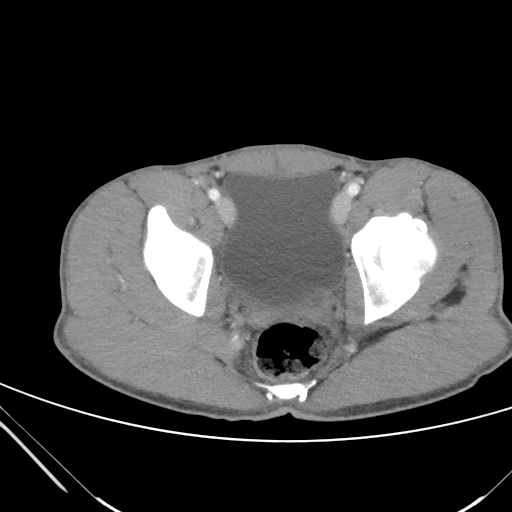
[im 27/81  soft-tissue]
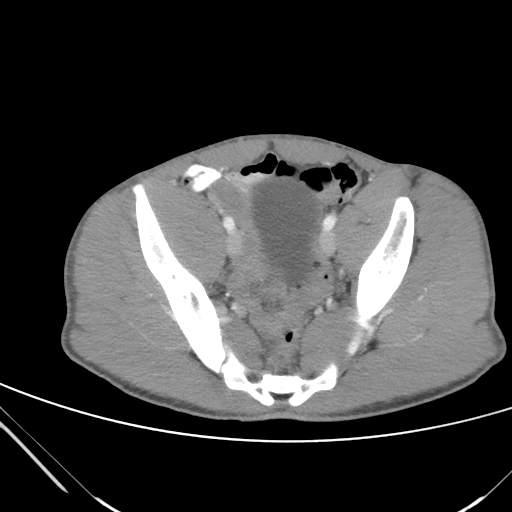
[im 34/81  soft-tissue]
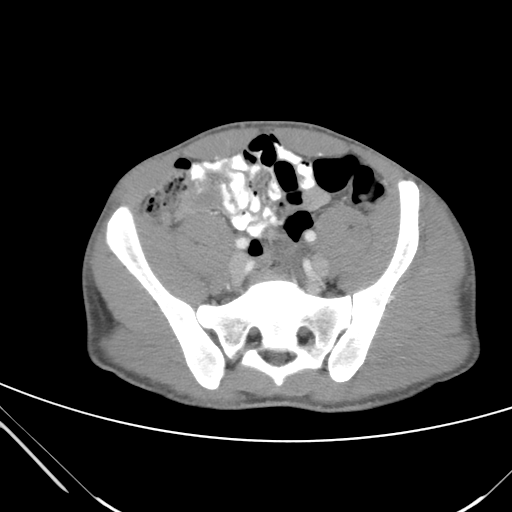
[im 37/81  soft-tissue]
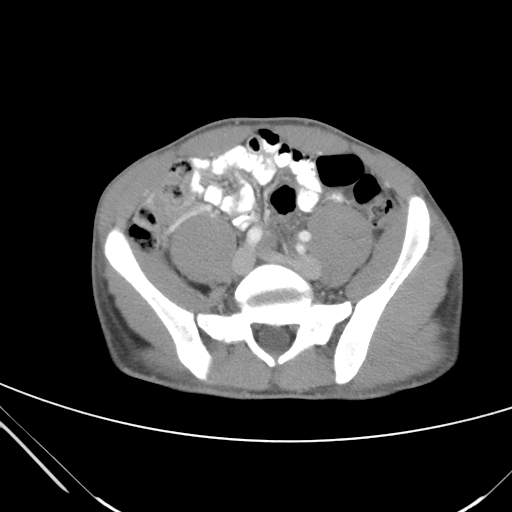
[im 44/81  soft-tissue]
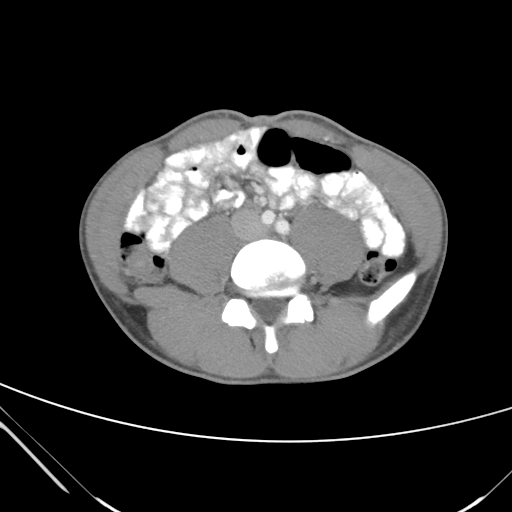
[im 47/81  soft-tissue]
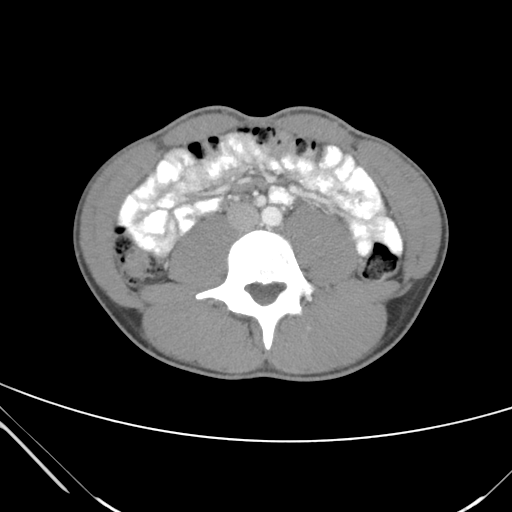
[im 47/81  bone]
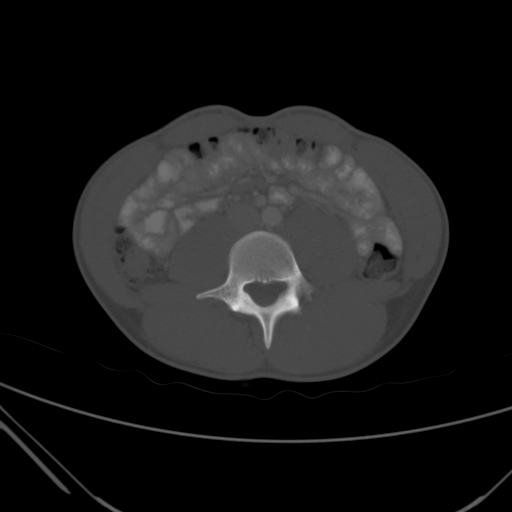
[im 54/81  soft-tissue]
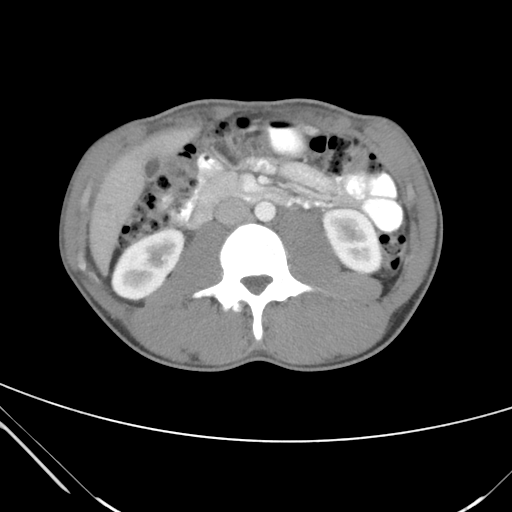
[im 61/81  soft-tissue]
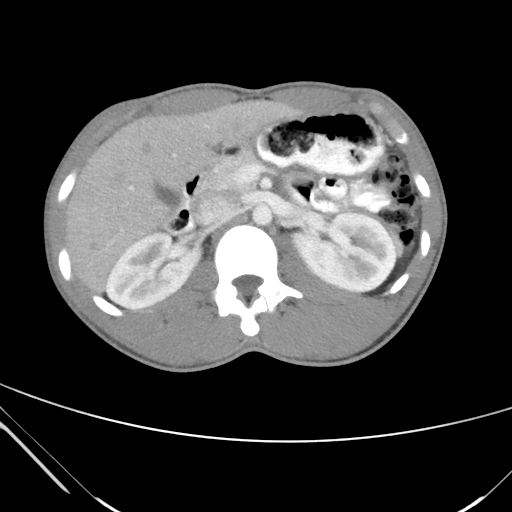
[im 64/81  soft-tissue]
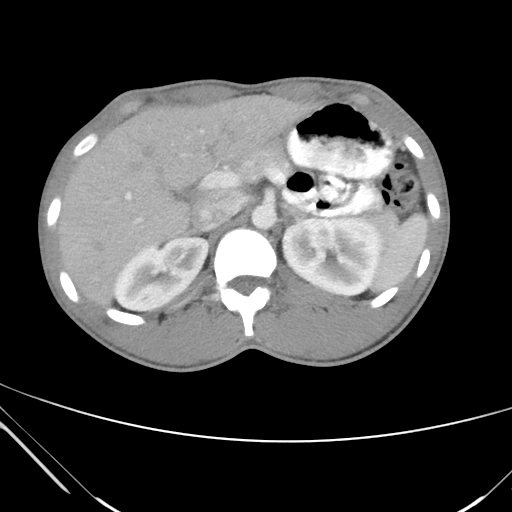
[im 71/81  soft-tissue]
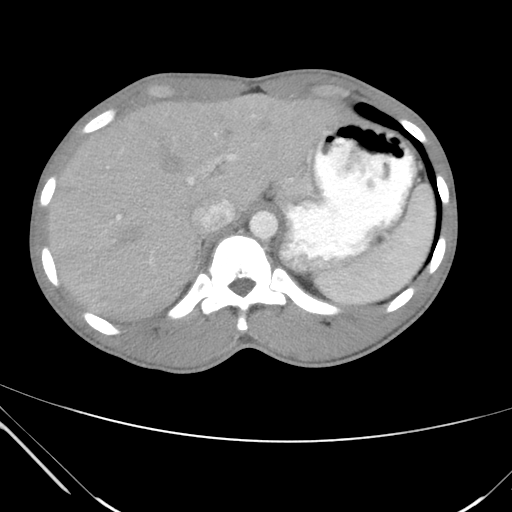
[im 77/81  soft-tissue]
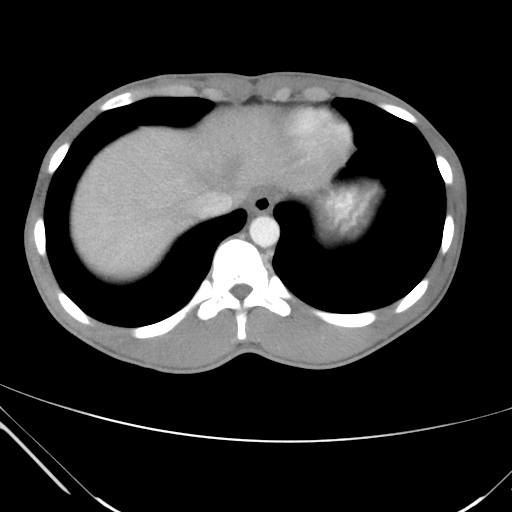

[coronals · coronal · 0.78mm/px · 3 of 65 slices shown]
[im 22/65  soft-tissue]
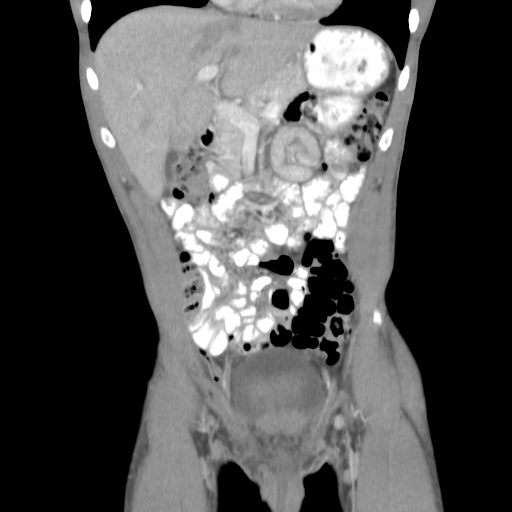
[im 29/65  soft-tissue]
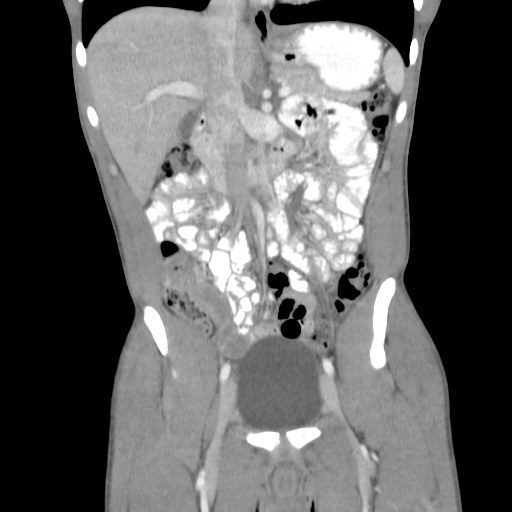
[im 36/65  soft-tissue]
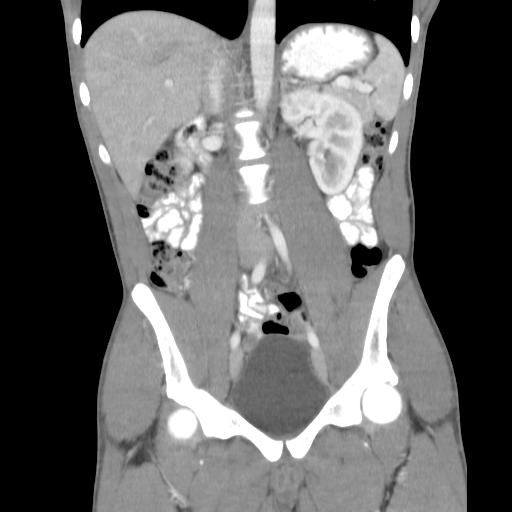

[17 of 46 positions shown; findings below may reference images not displayed]

FINDINGS: The visualized lung bases are clear.

The liver and spleen are unremarkable in appearance. The gallbladder
is within normal limits. The pancreas and adrenal glands are
unremarkable.

The kidneys are unremarkable in appearance. There is no evidence of
hydronephrosis. No renal or ureteral stones are seen. No perinephric
stranding is appreciated.

No free fluid is identified. The small bowel is unremarkable in
appearance. The stomach is within normal limits. No acute vascular
abnormalities are seen.

The appendix is filled with air and grossly normal in caliber,
without evidence for appendicitis. The colon is unremarkable in
appearance.

The bladder is mildly distended and grossly unremarkable. The
prostate is grossly normal in size, though difficult to fully
characterize due to surrounding vasculature. No inguinal
lymphadenopathy is seen.

No acute osseous abnormalities are identified.
IMPRESSION: Unremarkable contrast-enhanced CT of the abdomen and pelvis.
